# Patient Record
Sex: Male | Born: 1975 | Race: Asian | Hispanic: No | Marital: Single | Smoking: Never smoker
Health system: Southern US, Community
[De-identification: ages and names within clinical notes are randomized; demographics above are authoritative.]

## PROBLEM LIST (undated history)

## (undated) DIAGNOSIS — J45909 Unspecified asthma, uncomplicated: Secondary | ICD-10-CM

## (undated) DIAGNOSIS — Z8249 Family history of ischemic heart disease and other diseases of the circulatory system: Secondary | ICD-10-CM

## (undated) HISTORY — DX: Family history of ischemic heart disease and other diseases of the circulatory system: Z82.49

---

## 2004-12-22 ENCOUNTER — Emergency Department (HOSPITAL_COMMUNITY): Admission: EM | Admit: 2004-12-22 | Discharge: 2004-12-22 | Payer: Self-pay | Admitting: Family Medicine

## 2004-12-31 ENCOUNTER — Emergency Department (HOSPITAL_COMMUNITY): Admission: EM | Admit: 2004-12-31 | Discharge: 2004-12-31 | Payer: Self-pay | Admitting: Family Medicine

## 2005-02-11 ENCOUNTER — Emergency Department (HOSPITAL_COMMUNITY): Admission: EM | Admit: 2005-02-11 | Discharge: 2005-02-11 | Payer: Self-pay | Admitting: Emergency Medicine

## 2005-09-23 ENCOUNTER — Emergency Department (HOSPITAL_COMMUNITY): Admission: EM | Admit: 2005-09-23 | Discharge: 2005-09-23 | Payer: Self-pay | Admitting: Family Medicine

## 2006-02-24 ENCOUNTER — Emergency Department (HOSPITAL_COMMUNITY): Admission: EM | Admit: 2006-02-24 | Discharge: 2006-02-24 | Payer: Self-pay | Admitting: Family Medicine

## 2008-03-25 ENCOUNTER — Emergency Department (HOSPITAL_COMMUNITY): Admission: EM | Admit: 2008-03-25 | Discharge: 2008-03-25 | Payer: Self-pay | Admitting: Emergency Medicine

## 2009-03-27 IMAGING — CR DG CHEST 2V
2 series · 2 of 2 positions shown · non-contrast
Comparison: 02/11/2005

CLINICAL DATA: Cough

CHEST - 2 VIEW

[view not recorded (1 of 2)]
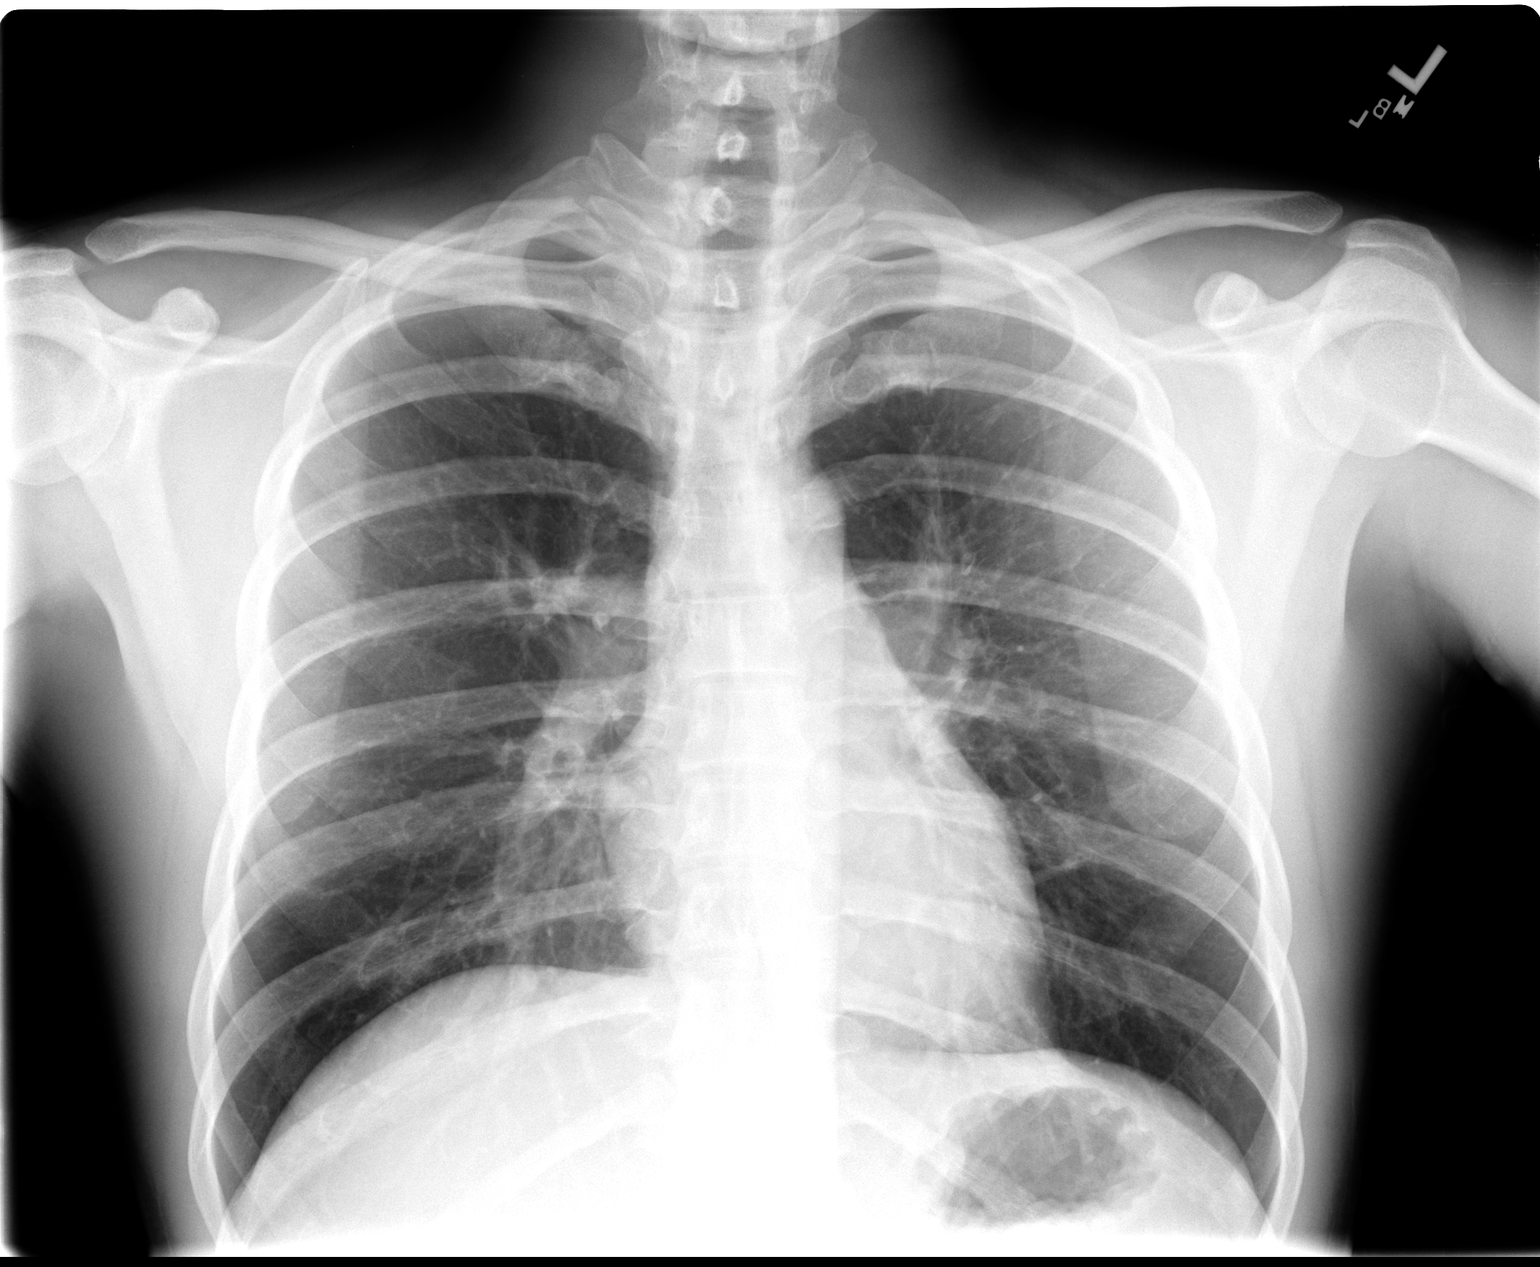

[view not recorded (2 of 2)]
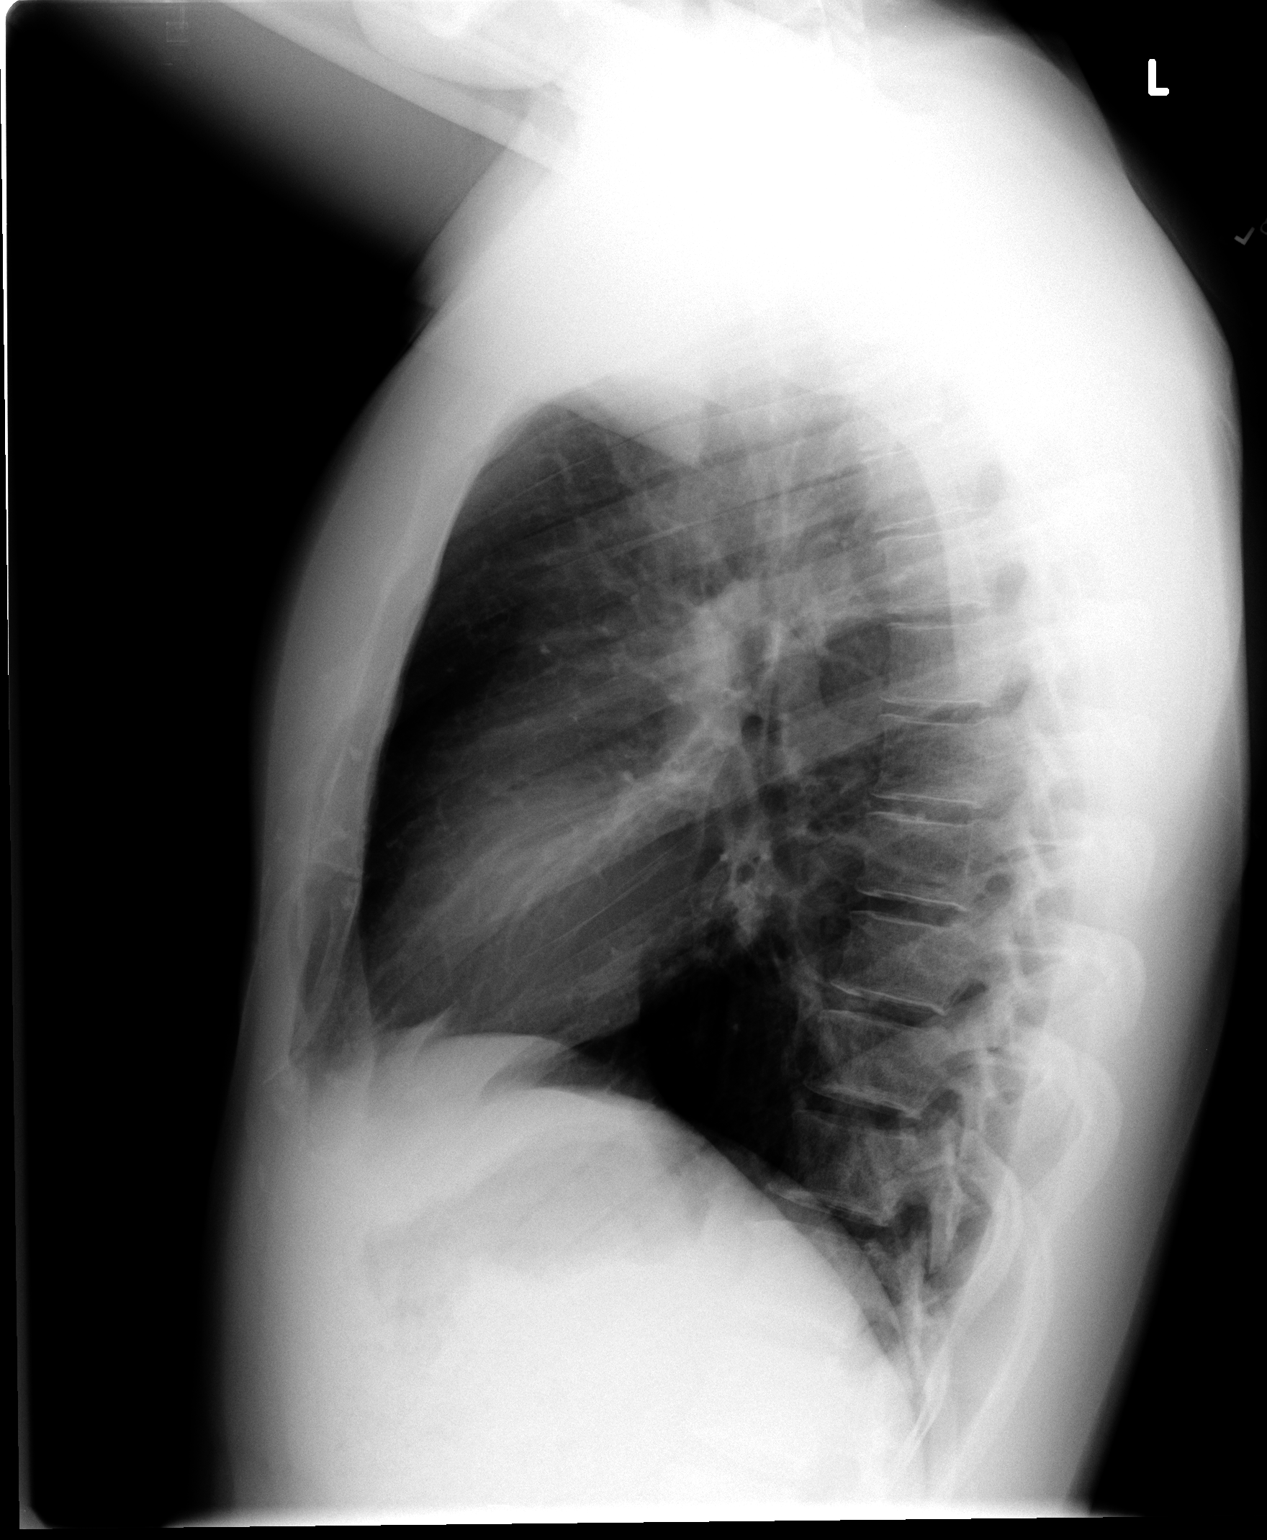

[2 of 2 positions shown; findings below may reference images not displayed]

FINDINGS: The cardiomediastinal silhouette is stable.  No acute
infiltrate or pleural effusion.  Bony thorax is unremarkable.  No
pulmonary edema.
IMPRESSION: No active disease.

## 2011-06-25 ENCOUNTER — Emergency Department (HOSPITAL_COMMUNITY)
Admission: EM | Admit: 2011-06-25 | Discharge: 2011-06-25 | Discharge status: Against Medical Advice | Payer: Self-pay | Attending: Emergency Medicine | Admitting: Emergency Medicine

## 2011-06-25 DIAGNOSIS — Z0389 Encounter for observation for other suspected diseases and conditions ruled out: Secondary | ICD-10-CM | POA: Insufficient documentation

## 2013-10-24 ENCOUNTER — Emergency Department (HOSPITAL_COMMUNITY)
Admission: EM | Admit: 2013-10-24 | Discharge: 2013-10-24 | Disposition: A | Discharge status: Home / Self Care | Payer: BC Managed Care – PPO | Attending: Emergency Medicine | Admitting: Emergency Medicine

## 2013-10-24 ENCOUNTER — Encounter (HOSPITAL_COMMUNITY): Payer: Self-pay | Admitting: Emergency Medicine

## 2013-10-24 DIAGNOSIS — R259 Unspecified abnormal involuntary movements: Secondary | ICD-10-CM | POA: Insufficient documentation

## 2013-10-24 DIAGNOSIS — T368X5A Adverse effect of other systemic antibiotics, initial encounter: Secondary | ICD-10-CM | POA: Insufficient documentation

## 2013-10-24 DIAGNOSIS — Z792 Long term (current) use of antibiotics: Secondary | ICD-10-CM | POA: Insufficient documentation

## 2013-10-24 DIAGNOSIS — N390 Urinary tract infection, site not specified: Secondary | ICD-10-CM | POA: Insufficient documentation

## 2013-10-24 DIAGNOSIS — J45909 Unspecified asthma, uncomplicated: Secondary | ICD-10-CM | POA: Insufficient documentation

## 2013-10-24 DIAGNOSIS — R000 Tachycardia, unspecified: Secondary | ICD-10-CM | POA: Insufficient documentation

## 2013-10-24 DIAGNOSIS — T50905A Adverse effect of unspecified drugs, medicaments and biological substances, initial encounter: Secondary | ICD-10-CM

## 2013-10-24 HISTORY — DX: Unspecified asthma, uncomplicated: J45.909

## 2013-10-24 LAB — URINE MICROSCOPIC-ADD ON

## 2013-10-24 LAB — CBC WITH DIFFERENTIAL/PLATELET
BASOS PCT: 2 % — AB (ref 0–1)
Basophils Absolute: 0.1 10*3/uL (ref 0.0–0.1)
EOS ABS: 0.3 10*3/uL (ref 0.0–0.7)
Eosinophils Relative: 6 % — ABNORMAL HIGH (ref 0–5)
HCT: 40.2 % (ref 39.0–52.0)
Hemoglobin: 13.7 g/dL (ref 13.0–17.0)
Lymphocytes Relative: 30 % (ref 12–46)
Lymphs Abs: 1.6 10*3/uL (ref 0.7–4.0)
MCH: 28 pg (ref 26.0–34.0)
MCHC: 34.1 g/dL (ref 30.0–36.0)
MCV: 82 fL (ref 78.0–100.0)
MONOS PCT: 8 % (ref 3–12)
Monocytes Absolute: 0.4 10*3/uL (ref 0.1–1.0)
NEUTROS ABS: 3 10*3/uL (ref 1.7–7.7)
NEUTROS PCT: 55 % (ref 43–77)
Platelets: 219 10*3/uL (ref 150–400)
RBC: 4.9 MIL/uL (ref 4.22–5.81)
RDW: 13.2 % (ref 11.5–15.5)
WBC: 5.5 10*3/uL (ref 4.0–10.5)

## 2013-10-24 LAB — URINALYSIS, ROUTINE W REFLEX MICROSCOPIC
BILIRUBIN URINE: NEGATIVE
Glucose, UA: NEGATIVE mg/dL
Hgb urine dipstick: NEGATIVE
Ketones, ur: NEGATIVE mg/dL
NITRITE: POSITIVE — AB
PH: 5.5 (ref 5.0–8.0)
Protein, ur: NEGATIVE mg/dL
Specific Gravity, Urine: 1.017 (ref 1.005–1.030)
Urobilinogen, UA: 1 mg/dL (ref 0.0–1.0)

## 2013-10-24 LAB — BASIC METABOLIC PANEL
BUN: 22 mg/dL (ref 6–23)
CALCIUM: 8.8 mg/dL (ref 8.4–10.5)
CO2: 23 meq/L (ref 19–32)
Chloride: 101 mEq/L (ref 96–112)
Creatinine, Ser: 1.16 mg/dL (ref 0.50–1.35)
GFR calc Af Amer: >90 mL/min (ref >90)
GFR, EST NON AFRICAN AMERICAN: 79 mL/min — AB (ref >90)
GLUCOSE: 99 mg/dL (ref 70–99)
Potassium: 3.6 mEq/L — ABNORMAL LOW (ref 3.7–5.3)
SODIUM: 139 meq/L (ref 137–147)

## 2013-10-24 MED ORDER — HYDROCODONE-ACETAMINOPHEN 5-325 MG PO TABS: 1.0000 | ORAL_TABLET | ORAL | Status: DC | PRN

## 2013-10-24 MED ORDER — LORAZEPAM 2 MG/ML IJ SOLN
0.5000 mg | Freq: Once | INTRAMUSCULAR | Status: AC
  Administered 2013-10-24: 0.5 mg via INTRAVENOUS
  Filled 2013-10-24: qty 1

## 2013-10-24 MED ORDER — CEPHALEXIN 500 MG PO CAPS: 500.0000 mg | ORAL_CAPSULE | Freq: Four times a day (QID) | ORAL | Status: DC

## 2013-10-24 MED ORDER — METHYLPREDNISOLONE SODIUM SUCC 125 MG IJ SOLR
125.0000 mg | Freq: Once | INTRAMUSCULAR | Status: AC
  Administered 2013-10-24: 125 mg via INTRAVENOUS
  Filled 2013-10-24: qty 2

## 2013-10-24 NOTE — Discharge Instructions (Signed)
Drug Allergy Allergic reactions to medicines are common. Some allergic reactions are mild. A delayed type of drug allergy that occurs 1 week or more after exposure to a medicine or vaccine is called serum sickness. A life-threatening, sudden (acute) allergic reaction that involves the whole body is called anaphylaxis. CAUSES  "True" drug allergies occur when there is an allergic reaction to a medicine. This is caused by overactivity of the immune system. First, the body becomes sensitized. The immune system is triggered by your first exposure to the medicine. Following this first exposure, future exposure to the same medicine may be life-threatening. Almost any medicine can cause an allergic reaction. Common ones are:  Penicillin.  Sulfonamides (sulfa drugs).  Local anesthetics.  X-ray dyes that contain iodine. SYMPTOMS  Common symptoms of a minor allergic reaction are:  Swelling around the mouth.  An itchy red rash or hives.  Vomiting or diarrhea. Anaphylaxis can cause swelling of the mouth and throat. This makes it difficult to breathe and swallow. Severe reactions can be fatal within seconds, even after exposure to only a trace amount of the drug that causes the reaction. HOME CARE INSTRUCTIONS   If you are unsure of what caused your reaction, keep a diary of foods and medicines used. Include the symptoms that followed. Avoid anything that causes reactions.  You may want to follow up with an allergy specialist after the reaction has cleared in order to be tested to confirm the allergy. It is important to confirm that your reaction is an allergy, not just a side effect to the medicine. If you have a true allergy to a medicine, this may prevent that medicine and related medicines from being given to you when you are very ill.  If you have hives or a rash:  Take medicines as directed by your caregiver.  You may use an over-the-counter antihistamine (diphenhydramine) as  needed.  Apply cold compresses to the skin or take baths in cool water. Avoid hot baths or showers.  If you are severely allergic:  Continuous observation after a severe reaction may be needed. Hospitalization is often required.  Wear a medical alert bracelet or necklace stating your allergy.  You and your family must learn how to use an anaphylaxis kit or give an epinephrine injection to temporarily treat an emergency allergic reaction. If you have had a severe reaction, always carry your epinephrine injection or anaphylaxis kit with you. This can be lifesaving if you have a severe reaction.  Do not drive or perform tasks after treatment until the medicines used to treat your reaction have worn off, or until your caregiver says it is okay. SEEK MEDICAL CARE IF:   You think you had an allergic reaction. Symptoms usually start within 30 minutes after exposure.  Symptoms are getting worse rather than better.  You develop new symptoms.  The symptoms that brought you to your caregiver return. SEEK IMMEDIATE MEDICAL CARE IF:   You have swelling of the mouth, difficulty breathing, or wheezing.  You have a tight feeling in your chest or throat.  You develop hives, swelling, or itching all over your body.  You develop severe vomiting or diarrhea.  You feel faint or pass out. This is an emergency. Use your epinephrine injection or anaphylaxis kit as you have been instructed. Call for emergency medical help. Even if you improve after the injection, you need to be examined at a hospital emergency department. MAKE SURE YOU:   Understand these instructions.  Will watch  your condition.  Will get help right away if you are not doing well or get worse. Document Released: 08/26/2005 Document Revised: 11/18/2011 Document Reviewed: 01/30/2011 [facility] Patient Information 2014 [place], [place].  Urinary Tract Infection Urinary tract infections (UTIs) can develop anywhere along your urinary  tract. Your urinary tract is your body's drainage system for removing wastes and extra water. Your urinary tract includes two kidneys, two ureters, a bladder, and a urethra. Your kidneys are a pair of bean-shaped organs. Each kidney is about the size of your fist. They are located below your ribs, one on each side of your spine. CAUSES Infections are caused by microbes, which are microscopic organisms, including fungi, viruses, and bacteria. These organisms are so small that they can only be seen through a microscope. Bacteria are the microbes that most commonly cause UTIs. SYMPTOMS  Symptoms of UTIs may vary by age and gender of the patient and by the location of the infection. Symptoms in young women typically include a frequent and intense urge to urinate and a painful, burning feeling in the bladder or urethra during urination. Older women and men are more likely to be tired, shaky, and weak and have muscle aches and abdominal pain. A fever may mean the infection is in your kidneys. Other symptoms of a kidney infection include pain in your back or sides below the ribs, nausea, and vomiting. DIAGNOSIS To diagnose a UTI, your caregiver will ask you about your symptoms. Your caregiver also will ask to provide a urine sample. The urine sample will be tested for bacteria and white blood cells. White blood cells are made by your body to help fight infection. TREATMENT  Typically, UTIs can be treated with medication. Because most UTIs are caused by a bacterial infection, they usually can be treated with the use of antibiotics. The choice of antibiotic and length of treatment depend on your symptoms and the type of bacteria causing your infection. HOME CARE INSTRUCTIONS  If you were prescribed antibiotics, take them exactly as your caregiver instructs you. Finish the medication even if you feel better after you have only taken some of the medication.  Drink enough water and fluids to keep your urine clear  or pale yellow.  Avoid caffeine, tea, and carbonated beverages. They tend to irritate your bladder.  Empty your bladder often. Avoid holding urine for long periods of time.  Empty your bladder before and after sexual intercourse.  After a bowel movement, women should cleanse from front to back. Use each tissue only once. SEEK MEDICAL CARE IF:   You have back pain.  You develop a fever.  Your symptoms do not begin to resolve within 3 days. SEEK IMMEDIATE MEDICAL CARE IF:   You have severe back pain or lower abdominal pain.  You develop chills.  You have nausea or vomiting.  You have continued burning or discomfort with urination. MAKE SURE YOU:   Understand these instructions.  Will watch your condition.  Will get help right away if you are not doing well or get worse. Document Released: 06/05/2005 Document Revised: 02/25/2012 Document Reviewed: 10/04/2011 [facility] Patient Information 2014 [place].

## 2013-10-24 NOTE — ED Notes (Signed)
[facility] for burning with urination.  He took Cipro and Phenazopyridine for the first time tonight and within 30 minutes his heart started racing. Also reports tingling in legs, weakness in bilateral legs, and dizziness.  When pt first arrived to triage HR was 165.  EKG obtained and pt now NSR with HR 92.

## 2013-10-24 NOTE — ED Notes (Signed)
Pt states he can tell HR has slowed down from what it was earlier

## 2013-10-24 NOTE — ED Provider Notes (Signed)
CSN: [id number]     Arrival date & time 10/24/13  2021 History   First MD Initiated Contact with Patient 10/24/13 2045     Chief Complaint  Patient presents with  . fast HR      (Consider location/radiation/quality/duration/timing/severity/associated sxs/prior Treatment) HPI  Patient presents to the ED with complaints of heart racing. He was seen at [facility] for burning with urination and diagnosed with a UTI. He was prescribed AZO and Cipro. He took this first dose this evening and less than 30 minutes later felt shaky, clammy feet and his heart racing. He called the pharmacist who recommended he come to the ER. He denied having any pains, syncope, weakness, nausea, vomiting or diarrhea. He is otherwise healthy, admits to currently feeling very anxious. HR returned to normal without intervention.  He denies at any time getting short of breath, having pharyngeal swelling, tongue or lip swelling.  Past Medical History  Diagnosis Date  . Asthma    History reviewed. No pertinent past surgical history. No family history on file. History  Substance Use Topics  . Smoking status: Never Smoker   . Smokeless tobacco: Not on file  . Alcohol Use: No    Review of Systems The patient denies anorexia, fever, weight loss,, vision loss, decreased hearing, hoarseness, chest pain, syncope, dyspnea on exertion, peripheral edema, balance deficits, hemoptysis, abdominal pain, melena, hematochezia, severe indigestion/heartburn, hematuria, incontinence, genital sores, muscle weakness, suspicious skin lesions, transient blindness, difficulty walking, depression, unusual weight change, abnormal bleeding, enlarged lymph nodes, angioedema, and breast masses.    Allergies  Ciprofloxacin  Home Medications   Current Outpatient Rx  Name  Route  Sig  Dispense  Refill  . ciprofloxacin (CIPRO) 500 MG tablet   Oral   Take 500 mg by mouth 2 (two) times daily.         .  phenazopyridine (PYRIDIUM) 200 MG tablet   Oral   Take 200 mg by mouth 3 (three) times daily as needed for pain.         . cephALEXin (KEFLEX) 500 MG capsule   Oral   Take 1 capsule (500 mg total) by mouth 4 (four) times daily.   40 capsule   0   . HYDROcodone-acetaminophen (NORCO/VICODIN) 5-325 MG per tablet   Oral   Take 1-2 tablets by mouth every 4 (four) hours as needed.   10 tablet   0    BP 147/83  Pulse 90  Temp(Src) 98.2 F (36.8 C) (Oral)  Resp 20  Wt 153 lb 6 oz (69.57 kg)  SpO2 100% Physical Exam  Nursing note and vitals reviewed. Constitutional: He appears well-developed and well-nourished. No distress.  HENT:  Head: Normocephalic and atraumatic.  Eyes: Pupils are equal, round, and reactive to light.  Neck: Normal range of motion. Neck supple.  Cardiovascular: Normal rate and regular rhythm.   Pulmonary/Chest: Effort normal.  Abdominal: Soft.  Neurological: He is alert.  Skin: Skin is warm and dry.    ED Course  Procedures (including critical care time) Labs Review Labs Reviewed  CBC WITH DIFFERENTIAL - Abnormal; Notable for the following:    Eosinophils Relative 6 (*)    Basophils Relative 2 (*)    All other components within normal limits  BASIC METABOLIC PANEL - Abnormal; Notable for the following:    Potassium 3.6 (*)    GFR calc non Af Amer 79 (*)    All other components within normal limits  URINALYSIS, ROUTINE  W REFLEX MICROSCOPIC - Abnormal; Notable for the following:    Color, Urine ORANGE (*)    Nitrite POSITIVE (*)    Leukocytes, UA SMALL (*)    All other components within normal limits  URINE CULTURE  URINE MICROSCOPIC-ADD ON   Imaging Review No results found.  EKG Interpretation    Date/Time:  Sunday October 24 2013 20:26:40 EST Ventricular Rate:  92 PR Interval:  170 QRS Duration: 82 QT Interval:  340 QTC Calculation: 420 R Axis:   90 Text Interpretation:  Normal sinus rhythm Rightward axis Borderline ECG No old  tracing to compare Confirmed by [facility]  MD, CHARLES [phone]) on 10/24/2013 9:24:35 PM            MDM   Final diagnoses:  Medication reaction  UTI (lower urinary tract infection)    Patient monitored in the ED for 2 hours with no adverse events. Blood work reassuring, urinalysis shows UTI. Will dc cipro and start on Keflex.  38 y.o.Randy Good's evaluation in the Emergency Department is complete. It has been determined that no acute conditions requiring further emergency intervention are present at this time. The patient/guardian have been advised of the diagnosis and plan. We have discussed signs and symptoms that warrant return to the ED, such as changes or worsening in symptoms.  Vital signs are stable at discharge. Filed Vitals:   10/24/13 2115  BP:   Pulse: 90  Temp:   Resp: 20    Patient/guardian has voiced understanding and agreed to follow-up with the PCP or specialist.     Dorthula Matas, PA-C 10/24/13 2227

## 2013-10-24 NOTE — ED Provider Notes (Signed)
Medical screening examination/treatment/procedure(s) were performed by non-physician practitioner and as supervising physician I was immediately available for consultation/collaboration.  EKG Interpretation    Date/Time:  Sunday October 24 2013 20:26:40 EST Ventricular Rate:  92 PR Interval:  170 QRS Duration: 82 QT Interval:  340 QTC Calculation: 420 R Axis:   90 Text Interpretation:  Normal sinus rhythm Rightward axis Borderline ECG No old tracing to compare Confirmed by [facility]  MD, Marisella Puccio [phone] on 10/24/2013 9:24:35 PM              Randy Mostharles B. Bernette MayersSheldon, MD 10/24/13 2234

## 2013-10-25 ENCOUNTER — Emergency Department (HOSPITAL_COMMUNITY)
Admission: EM | Admit: 2013-10-25 | Discharge: 2013-10-25 | Disposition: A | Discharge status: Home / Self Care | Payer: BC Managed Care – PPO | Attending: Emergency Medicine | Admitting: Emergency Medicine

## 2013-10-25 ENCOUNTER — Encounter (HOSPITAL_COMMUNITY): Payer: Self-pay | Admitting: Emergency Medicine

## 2013-10-25 DIAGNOSIS — IMO0002 Reserved for concepts with insufficient information to code with codable children: Secondary | ICD-10-CM | POA: Insufficient documentation

## 2013-10-25 DIAGNOSIS — T361X5A Adverse effect of cephalosporins and other beta-lactam antibiotics, initial encounter: Secondary | ICD-10-CM | POA: Insufficient documentation

## 2013-10-25 DIAGNOSIS — J45909 Unspecified asthma, uncomplicated: Secondary | ICD-10-CM | POA: Insufficient documentation

## 2013-10-25 DIAGNOSIS — T7840XA Allergy, unspecified, initial encounter: Secondary | ICD-10-CM

## 2013-10-25 DIAGNOSIS — R221 Localized swelling, mass and lump, neck: Principal | ICD-10-CM

## 2013-10-25 DIAGNOSIS — N390 Urinary tract infection, site not specified: Secondary | ICD-10-CM | POA: Insufficient documentation

## 2013-10-25 DIAGNOSIS — R220 Localized swelling, mass and lump, head: Secondary | ICD-10-CM | POA: Insufficient documentation

## 2013-10-25 LAB — BASIC METABOLIC PANEL
BUN: 17 mg/dL (ref 6–23)
CALCIUM: 9.4 mg/dL (ref 8.4–10.5)
CO2: 26 meq/L (ref 19–32)
CREATININE: 1.08 mg/dL (ref 0.50–1.35)
Chloride: 104 mEq/L (ref 96–112)
GFR, EST NON AFRICAN AMERICAN: 86 mL/min — AB (ref >90)
Glucose, Bld: 88 mg/dL (ref 70–99)
Potassium: 3.8 mEq/L (ref 3.7–5.3)
SODIUM: 144 meq/L (ref 137–147)

## 2013-10-25 LAB — CBC
HEMATOCRIT: 41.5 % (ref 39.0–52.0)
Hemoglobin: 14.3 g/dL (ref 13.0–17.0)
MCH: 28.1 pg (ref 26.0–34.0)
MCHC: 34.5 g/dL (ref 30.0–36.0)
MCV: 81.5 fL (ref 78.0–100.0)
PLATELETS: 244 10*3/uL (ref 150–400)
RBC: 5.09 MIL/uL (ref 4.22–5.81)
RDW: 12.9 % (ref 11.5–15.5)
WBC: 13.7 10*3/uL — AB (ref 4.0–10.5)

## 2013-10-25 LAB — URINE CULTURE
COLONY COUNT: NO GROWTH
CULTURE: NO GROWTH

## 2013-10-25 MED ORDER — METHYLPREDNISOLONE SODIUM SUCC 125 MG IJ SOLR
125.0000 mg | Freq: Once | INTRAMUSCULAR | Status: AC
  Administered 2013-10-25: 125 mg via INTRAVENOUS
  Filled 2013-10-25: qty 2

## 2013-10-25 MED ORDER — CEFTRIAXONE SODIUM 250 MG IJ SOLR
250.0000 mg | Freq: Once | INTRAMUSCULAR | Status: AC
  Administered 2013-10-25: 250 mg via INTRAMUSCULAR
  Filled 2013-10-25: qty 250

## 2013-10-25 MED ORDER — AZITHROMYCIN 250 MG PO TABS
1000.0000 mg | ORAL_TABLET | Freq: Once | ORAL | Status: AC
  Administered 2013-10-25: 1000 mg via ORAL
  Filled 2013-10-25: qty 4

## 2013-10-25 MED ORDER — LIDOCAINE HCL (PF) 1 % IJ SOLN
INTRAMUSCULAR | Status: AC
  Filled 2013-10-25: qty 5

## 2013-10-25 MED ORDER — PREDNISONE 10 MG PO TABS: 20.0000 mg | ORAL_TABLET | Freq: Every day | ORAL | Status: DC

## 2013-10-25 MED ORDER — FAMOTIDINE IN NACL 20-0.9 MG/50ML-% IV SOLN
20.0000 mg | Freq: Once | INTRAVENOUS | Status: AC
  Administered 2013-10-25: 20 mg via INTRAVENOUS
  Filled 2013-10-25: qty 50

## 2013-10-25 MED ORDER — SODIUM CHLORIDE 0.9 % IV SOLN
INTRAVENOUS | Status: DC
Start: 2013-10-25 — End: 2013-10-25
  Administered 2013-10-25: 19:00:00 via INTRAVENOUS

## 2013-10-25 MED ORDER — DIPHENHYDRAMINE HCL 50 MG/ML IJ SOLN
25.0000 mg | Freq: Once | INTRAMUSCULAR | Status: AC
  Administered 2013-10-25: 25 mg via INTRAVENOUS
  Filled 2013-10-25: qty 1

## 2013-10-25 MED ORDER — SODIUM CHLORIDE 0.9 % IV BOLUS (SEPSIS)
1000.0000 mL | Freq: Once | INTRAVENOUS | Status: AC
  Administered 2013-10-25: 1000 mL via INTRAVENOUS

## 2013-10-25 NOTE — ED Notes (Signed)
MD made aware of medication complete.

## 2013-10-25 NOTE — ED Notes (Signed)
Pt was seen at urgent care and was placed on anbx and made heart race.  Came to ED last nite and started patient on another anbx and took first dose at 1200 and now feels like throat is swelling and hurts to swallow

## 2013-10-25 NOTE — ED Notes (Signed)
Provider at the bedside.  

## 2013-10-25 NOTE — ED Provider Notes (Signed)
CSN: [id number]     Arrival date & time 10/25/13  1446 History   First MD Initiated Contact with Patient 10/25/13 1526     Chief Complaint  Patient presents with  . Allergic Reaction     (Consider location/radiation/quality/duration/timing/severity/associated sxs/prior Treatment) HPI  Patient to the ER for evaluation of medication reaction. He was seen yesterday at an Urgent Care and given Cipro and Azo for a UTI. He developed tachycardia 30 minutes afterwards and came to the ER for evaluation. Triage saw his pulse at 165 but when EKG was done it has resolved and was back to normal. He was monitored in the ED and had a normal exam and stay. His Cipro and Azo was discontinued and he was written for Keflex. He reports feeling 100% normal until at 12 pm today he took the first dose of Keflex and then 30 minutes later when he tried to eat lunch he felt as though his throat was swelling and had to spit out food. He denied feeling short of breath. Was able to "slowly swallow water". His vital signs are stable in the ER, he is speaking in full sentences. Denies tongue or lip swelling. Denies chest pains or tachycardia. He says he has only ever taken amoxicillin but some of his family members have a lot abx allergies. A&O x 3, VSS all WNL.  Past Medical History  Diagnosis Date  . Asthma    History reviewed. No pertinent past surgical history. No family history on file. History  Substance Use Topics  . Smoking status: Never Smoker   . Smokeless tobacco: Not on file  . Alcohol Use: No    Review of Systems The patient denies anorexia, fever, weight loss,, vision loss, decreased hearing, hoarseness, chest pain, syncope, dyspnea on exertion, peripheral edema, balance deficits, hemoptysis, abdominal pain, melena, hematochezia, severe indigestion/heartburn, hematuria, incontinence, genital sores, muscle weakness, suspicious skin lesions, transient blindness, difficulty walking, depression, unusual  weight change, abnormal bleeding, enlarged lymph nodes,  and breast masses.    Allergies  Cephalosporins; Ciprofloxacin; and Pyridium  Home Medications   Current Outpatient Rx  Name  Route  Sig  Dispense  Refill  . predniSONE (DELTASONE) 10 MG tablet   Oral   Take 2 tablets (20 mg total) by mouth daily.   21 tablet   0     Prednisone dose pack directions:   6 tabs on day ...    BP 133/87  Pulse 102  Temp(Src) 98.5 F (36.9 C) (Oral)  Resp 16  SpO2 99% Physical Exam  Nursing note and vitals reviewed. Constitutional: He appears well-developed and well-nourished. No distress.  HENT:  Head: Normocephalic and atraumatic.  Eyes: Pupils are equal, round, and reactive to light.  Neck: Trachea normal and normal range of motion. Neck supple. No spinous process tenderness and no muscular tenderness present. No rigidity. No edema, no erythema and normal range of motion present.  Cardiovascular: Normal rate and regular rhythm.   Pulmonary/Chest: Effort normal and breath sounds normal. He has no decreased breath sounds. He has no wheezes. He has no rhonchi. He has no rales.  Abdominal: Soft.  Neurological: He is alert.  Skin: Skin is warm and dry.    ED Course  Procedures (including critical care time) Labs Review Labs Reviewed  CBC - Abnormal; Notable for the following:    WBC 13.7 (*)    All other components within normal limits  BASIC METABOLIC PANEL - Abnormal; Notable for the following:  GFR calc non Af Amer 86 (*)    All other components within normal limits  GC/CHLAMYDIA PROBE AMP   Imaging Review No results found.  EKG Interpretation   None       MDM   Final diagnoses:  Allergic reaction  UTI (lower urinary tract infection)    125 mg IV Solumedrol, 25 mg  Benadryl, Pepcid and saline given.   Patient monitored in the ER. Given IM Rocephin and 1gram Azithromycin.  Discharged with prednisone dose pack.  38 y.o.Randy Good's evaluation in the  Emergency Department is complete. It has been determined that no acute conditions requiring further emergency intervention are present at this time. The patient/guardian have been advised of the diagnosis and plan. We have discussed signs and symptoms that warrant return to the ED, such as changes or worsening in symptoms.  Vital signs are stable at discharge. Filed Vitals:   10/25/13 1943  BP: 133/87  Pulse: 102  Temp:   Resp: 16    Patient/guardian has voiced understanding and agreed to follow-up with the PCP or specialist.     Dorthula Matasiffany G Adante Courington, PA-C 10/25/13 1949

## 2013-10-25 NOTE — Discharge Instructions (Signed)
Allergies °Allergies may happen from anything your body is sensitive to. This may be food, medicines, pollens, chemicals, and nearly anything around you in everyday life that produces allergens. An allergen is anything that causes an allergy producing substance. Heredity is often a factor in causing these problems. This means you may have some of the same allergies as your parents. °Food allergies happen in all age groups. Food allergies are some of the most severe and life threatening. Some common food allergies are cow's milk, seafood, eggs, nuts, wheat, and soybeans. °SYMPTOMS  °· Swelling around the mouth. °· An itchy red rash or hives. °· Vomiting or diarrhea. °· Difficulty breathing. °SEVERE ALLERGIC REACTIONS ARE LIFE-THREATENING. °This reaction is called anaphylaxis. It can cause the mouth and throat to swell and cause difficulty with breathing and swallowing. In severe reactions only a trace amount of food (for example, peanut oil in a salad) may cause death within seconds. °Seasonal allergies occur in all age groups. These are seasonal because they usually occur during the same season every year. They may be a reaction to molds, grass pollens, or tree pollens. Other causes of problems are house dust mite allergens, pet dander, and mold spores. The symptoms often consist of nasal congestion, a runny itchy nose associated with sneezing, and tearing itchy eyes. There is often an associated itching of the mouth and ears. The problems happen when you come in contact with pollens and other allergens. Allergens are the particles in the air that the body reacts to with an allergic reaction. This causes you to release allergic antibodies. Through a chain of events, these eventually cause you to release histamine into the blood stream. Although it is meant to be protective to the body, it is this release that causes your discomfort. This is why you were given anti-histamines to feel better.  If you are unable to  pinpoint the offending allergen, it may be determined by skin or blood testing. Allergies cannot be cured but can be controlled with medicine. °Hay fever is a collection of all or some of the seasonal allergy problems. It may often be treated with simple over-the-counter medicine such as diphenhydramine. Take medicine as directed. Do not drink alcohol or drive while taking this medicine. Check with your caregiver or package insert for child dosages. °If these medicines are not effective, there are many new medicines your caregiver can prescribe. Stronger medicine such as nasal spray, eye drops, and corticosteroids may be used if the first things you try do not work well. Other treatments such as immunotherapy or desensitizing injections can be used if all else fails. Follow up with your caregiver if problems continue. These seasonal allergies are usually not life threatening. They are generally more of a nuisance that can often be handled using medicine. °HOME CARE INSTRUCTIONS  °· If unsure what causes a reaction, keep a diary of foods eaten and symptoms that follow. Avoid foods that cause reactions. °· If hives or rash are present: °· Take medicine as directed. °· You may use an over-the-counter antihistamine (diphenhydramine) for hives and itching as needed. °· Apply cold compresses (cloths) to the skin or take baths in cool water. Avoid hot baths or showers. Heat will make a rash and itching worse. °· If you are severely allergic: °· Following a treatment for a severe reaction, hospitalization is often required for closer follow-up. °· Wear a medic-alert bracelet or necklace stating the allergy. °· You and your family must learn how to give adrenaline or use   an anaphylaxis kit.  If you have had a severe reaction, always carry your anaphylaxis kit or EpiPen with you. Use this medicine as directed by your caregiver if a severe reaction is occurring. Failure to do so could have a fatal outcome. SEEK MEDICAL  CARE IF:  You suspect a food allergy. Symptoms generally happen within 30 minutes of eating a food.  Your symptoms have not gone away within 2 days or are getting worse.  You develop new symptoms.  You want to retest yourself or your child with a food or drink you think causes an allergic reaction. Never do this if an anaphylactic reaction to that food or drink has happened before. Only do this under the care of a caregiver. SEEK IMMEDIATE MEDICAL CARE IF:   You have difficulty breathing, are wheezing, or have a tight feeling in your chest or throat.  You have a swollen mouth, or you have hives, swelling, or itching all over your body.  You have had a severe reaction that has responded to your anaphylaxis kit or an EpiPen. These reactions may return when the medicine has worn off. These reactions should be considered life threatening. MAKE SURE YOU:   Understand these instructions.  Will watch your condition.  Will get help right away if you are not doing well or get worse. Document Released: 11/19/2002 Document Revised: 12/21/2012 Document Reviewed: 04/25/2008 [facility] Patient Information 2014 [place].  Urinary Tract Infection Urinary tract infections (UTIs) can develop anywhere along your urinary tract. Your urinary tract is your body's drainage system for removing wastes and extra water. Your urinary tract includes two kidneys, two ureters, a bladder, and a urethra. Your kidneys are a pair of bean-shaped organs. Each kidney is about the size of your fist. They are located below your ribs, one on each side of your spine. CAUSES Infections are caused by microbes, which are microscopic organisms, including fungi, viruses, and bacteria. These organisms are so small that they can only be seen through a microscope. Bacteria are the microbes that most commonly cause UTIs. SYMPTOMS  Symptoms of UTIs may vary by age and gender of the patient and by the location of the infection.  Symptoms in young women typically include a frequent and intense urge to urinate and a painful, burning feeling in the bladder or urethra during urination. Older women and men are more likely to be tired, shaky, and weak and have muscle aches and abdominal pain. A fever may mean the infection is in your kidneys. Other symptoms of a kidney infection include pain in your back or sides below the ribs, nausea, and vomiting. DIAGNOSIS To diagnose a UTI, your caregiver will ask you about your symptoms. Your caregiver also will ask to provide a urine sample. The urine sample will be tested for bacteria and white blood cells. White blood cells are made by your body to help fight infection. TREATMENT  Typically, UTIs can be treated with medication. Because most UTIs are caused by a bacterial infection, they usually can be treated with the use of antibiotics. The choice of antibiotic and length of treatment depend on your symptoms and the type of bacteria causing your infection. HOME CARE INSTRUCTIONS  If you were prescribed antibiotics, take them exactly as your caregiver instructs you. Finish the medication even if you feel better after you have only taken some of the medication.  Drink enough water and fluids to keep your urine clear or pale yellow.  Avoid caffeine, tea, and carbonated beverages.  They tend to irritate your bladder.  Empty your bladder often. Avoid holding urine for long periods of time.  Empty your bladder before and after sexual intercourse.  After a bowel movement, women should cleanse from front to back. Use each tissue only once. SEEK MEDICAL CARE IF:   You have back pain.  You develop a fever.  Your symptoms do not begin to resolve within 3 days. SEEK IMMEDIATE MEDICAL CARE IF:   You have severe back pain or lower abdominal pain.  You develop chills.  You have nausea or vomiting.  You have continued burning or discomfort with urination. MAKE SURE YOU:   Understand  these instructions.  Will watch your condition.  Will get help right away if you are not doing well or get worse. Document Released: 06/05/2005 Document Revised: 02/25/2012 Document Reviewed: 10/04/2011 [facility] Patient Information 2014 [place].

## 2013-10-25 NOTE — ED Notes (Signed)
MD at bedside. 

## 2013-10-25 NOTE — ED Notes (Signed)
Pt feels like something is sitting in chest area

## 2013-10-26 LAB — GC/CHLAMYDIA PROBE AMP
CT Probe RNA: NEGATIVE
GC Probe RNA: NEGATIVE

## 2013-10-26 NOTE — ED Provider Notes (Signed)
Medical screening examination/treatment/procedure(s) were performed by non-physician practitioner and as supervising physician I was immediately available for consultation/collaboration.  EKG Interpretation   None        Hurman HornJohn M Tamica Covell, MD 10/26/13 [id number]

## 2013-12-01 ENCOUNTER — Encounter: Payer: Self-pay | Admitting: Cardiovascular Disease

## 2013-12-01 ENCOUNTER — Ambulatory Visit (INDEPENDENT_AMBULATORY_CARE_PROVIDER_SITE_OTHER): Payer: BC Managed Care – PPO | Admitting: Cardiovascular Disease

## 2013-12-01 VITALS — BP 140/96 | HR 88 | Ht 65.0 in | Wt 147.9 lb

## 2013-12-01 DIAGNOSIS — Z8249 Family history of ischemic heart disease and other diseases of the circulatory system: Secondary | ICD-10-CM

## 2013-12-01 NOTE — Assessment & Plan Note (Signed)
The patient was self referred because of a brother who had a heart attack at age 38. He has no chronic receptors. He's never had a heart attack or stroke. He denies chest pain or shortness of breath. He appears in good health and exercises frequently. I do not see a need to perform any functional testing at this time and we'll see him back when necessary. I have reassured him.

## 2013-12-01 NOTE — Progress Notes (Signed)
     12/01/2013 Randy Good   11/15/1975  [id number]  Primary Physician Jolene ProvostHAIMES,DAVID M, MD Primary Cardiologist: Runell GessJonathan J. Hlee Fringer MD Roseanne RenoFACP,FACC,FAHA, FSCAI   HPI:  [name] is a fit-appearing 38 year old married Asian male father of one to be 2 children self referred for cardiovascular evaluation because of a family history of heart disease. Apparently has a brother who at age 38 had a myocardial infarction. He has no other cardiac risk factors. 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 He is completely asymptomatic.   No current outpatient prescriptions on file.   No current facility-administered medications for this visit.    Allergies  Allergen Reactions  . Cephalosporins Other (See Comments)    Tightness in chest  . Ciprofloxacin Other (See Comments)    Cold sweats, increased Bp  . Pyridium [Phenazopyridine Hcl] Other (See Comments)    Dizziness, tightness in chest, frequent urination    History   Social History  . Marital Status: Single    Spouse Name: N/A    Number of Children: N/A  . Years of Education: N/A   Occupational History  . Not on file.   Social History Main Topics  . Smoking status: Never Smoker   . Smokeless tobacco: Not on file  . Alcohol Use: No  . Drug Use: No  . Sexual Activity: Not on file   Other Topics Concern  . Not on file   Social History Narrative  . No narrative on file     Review of Systems: General: negative for chills, fever, night sweats or weight changes.  Cardiovascular: negative for chest pain, dyspnea on exertion, edema, orthopnea, palpitations, paroxysmal nocturnal dyspnea or shortness of breath Dermatological: negative for rash Respiratory: negative for cough or wheezing Urologic: negative for hematuria Abdominal: negative for nausea, vomiting, diarrhea, bright red blood per rectum, melena, or hematemesis Neurologic: negative for visual changes, syncope, or dizziness All other systems reviewed and are  otherwise negative except as noted above.    Blood pressure 140/96, pulse 88, height 5\' 5"  (1.651 m), weight 147 lb 14.4 oz (67.087 kg).  General appearance: alert and no distress Neck: no adenopathy, no carotid bruit, no JVD, supple, symmetrical, trachea midline and thyroid not enlarged, symmetric, no tenderness/mass/nodules Lungs: clear to auscultation bilaterally Heart: regular rate and rhythm, S1, S2 normal, no murmur, click, rub or gallop Abdomen: soft, non-tender; bowel sounds normal; no masses,  no organomegaly Extremities: extremities normal, atraumatic, no cyanosis or edema  EKG normal sinus rhythm at 88 without ST or T wave changes  ASSESSMENT AND PLAN:   Family history of heart disease The patient was self referred because of a brother who had a heart attack at age 38. He has no chronic receptors. He's never had a heart attack or stroke. He denies chest pain or shortness of breath. He appears in good health and exercises frequently. I do not see a need to perform any functional testing at this time and we'll see him back when necessary. I have reassured him.      Runell GessJonathan J. Lottie Sigman MD FACP,FACC,FAHA, [facility]man[facility]SCAI 12/01/2013 11:46 AM

## 2013-12-01 NOTE — Patient Instructions (Signed)
Follow up Dr Allyson SabalBerry has needed.

## 2020-06-20 ENCOUNTER — Other Ambulatory Visit: Payer: Self-pay

## 2020-06-20 DIAGNOSIS — Z20822 Contact with and (suspected) exposure to covid-19: Secondary | ICD-10-CM

## 2020-06-21 LAB — SARS-COV-2, NAA 2 DAY TAT

## 2020-06-21 LAB — NOVEL CORONAVIRUS, NAA: SARS-CoV-2, NAA: NOT DETECTED
